# Patient Record
Sex: Male | Born: 2000 | Race: Asian | Hispanic: No | Marital: Single | Smoking: Never smoker
Health system: Southern US, Community
[De-identification: ages and names within clinical notes are randomized; demographics above are authoritative.]

## PROBLEM LIST (undated history)

## (undated) DIAGNOSIS — E669 Obesity, unspecified: Secondary | ICD-10-CM

## (undated) DIAGNOSIS — T7840XA Allergy, unspecified, initial encounter: Secondary | ICD-10-CM

## (undated) HISTORY — DX: Obesity, unspecified: E66.9

---
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
## 2020-06-28 ENCOUNTER — Encounter (INDEPENDENT_AMBULATORY_CARE_PROVIDER_SITE_OTHER): Payer: Self-pay | Admitting: Dietician
